# Patient Record
Sex: Male | Born: 1965 | Race: White | Hispanic: No | Marital: Married | Smoking: Never smoker
Health system: Southern US, Community
[De-identification: ages and names within clinical notes are randomized; demographics above are authoritative.]

## PROBLEM LIST (undated history)

## (undated) DIAGNOSIS — S069X9A Unspecified intracranial injury with loss of consciousness of unspecified duration, initial encounter: Secondary | ICD-10-CM

## (undated) DIAGNOSIS — I447 Left bundle-branch block, unspecified: Secondary | ICD-10-CM

## (undated) DIAGNOSIS — K219 Gastro-esophageal reflux disease without esophagitis: Secondary | ICD-10-CM

## (undated) DIAGNOSIS — E1342 Other specified diabetes mellitus with diabetic polyneuropathy: Secondary | ICD-10-CM

## (undated) DIAGNOSIS — G4730 Sleep apnea, unspecified: Secondary | ICD-10-CM

## (undated) DIAGNOSIS — E119 Type 2 diabetes mellitus without complications: Secondary | ICD-10-CM

## (undated) DIAGNOSIS — I10 Essential (primary) hypertension: Secondary | ICD-10-CM

## (undated) DIAGNOSIS — S069XAA Unspecified intracranial injury with loss of consciousness status unknown, initial encounter: Secondary | ICD-10-CM

## (undated) DIAGNOSIS — H9192 Unspecified hearing loss, left ear: Secondary | ICD-10-CM

## (undated) DIAGNOSIS — I2510 Atherosclerotic heart disease of native coronary artery without angina pectoris: Secondary | ICD-10-CM

## (undated) HISTORY — PX: FISTULOTOMY: SHX6413

## (undated) HISTORY — PX: PENILE PROSTHESIS IMPLANT: SHX240

## (undated) HISTORY — PX: OTHER SURGICAL HISTORY: SHX169

## (undated) HISTORY — PX: TONSILLECTOMY: SUR1361

## (undated) HISTORY — PX: FRACTURE SURGERY: SHX138

---

## 2007-07-20 ENCOUNTER — Emergency Department (HOSPITAL_COMMUNITY): Admission: EM | Admit: 2007-07-20 | Discharge: 2007-07-21 | Payer: Self-pay | Admitting: Emergency Medicine

## 2008-10-15 IMAGING — CR DG ELBOW COMPLETE 3+V*L*
4 series · 4 of 4 positions shown · non-contrast
Comparison: None

CLINICAL DATA: Kicked by a horse.  Elbow pain.

LEFT ELBOW - COMPLETE 3+ VIEW

[x elbow joint ap left]
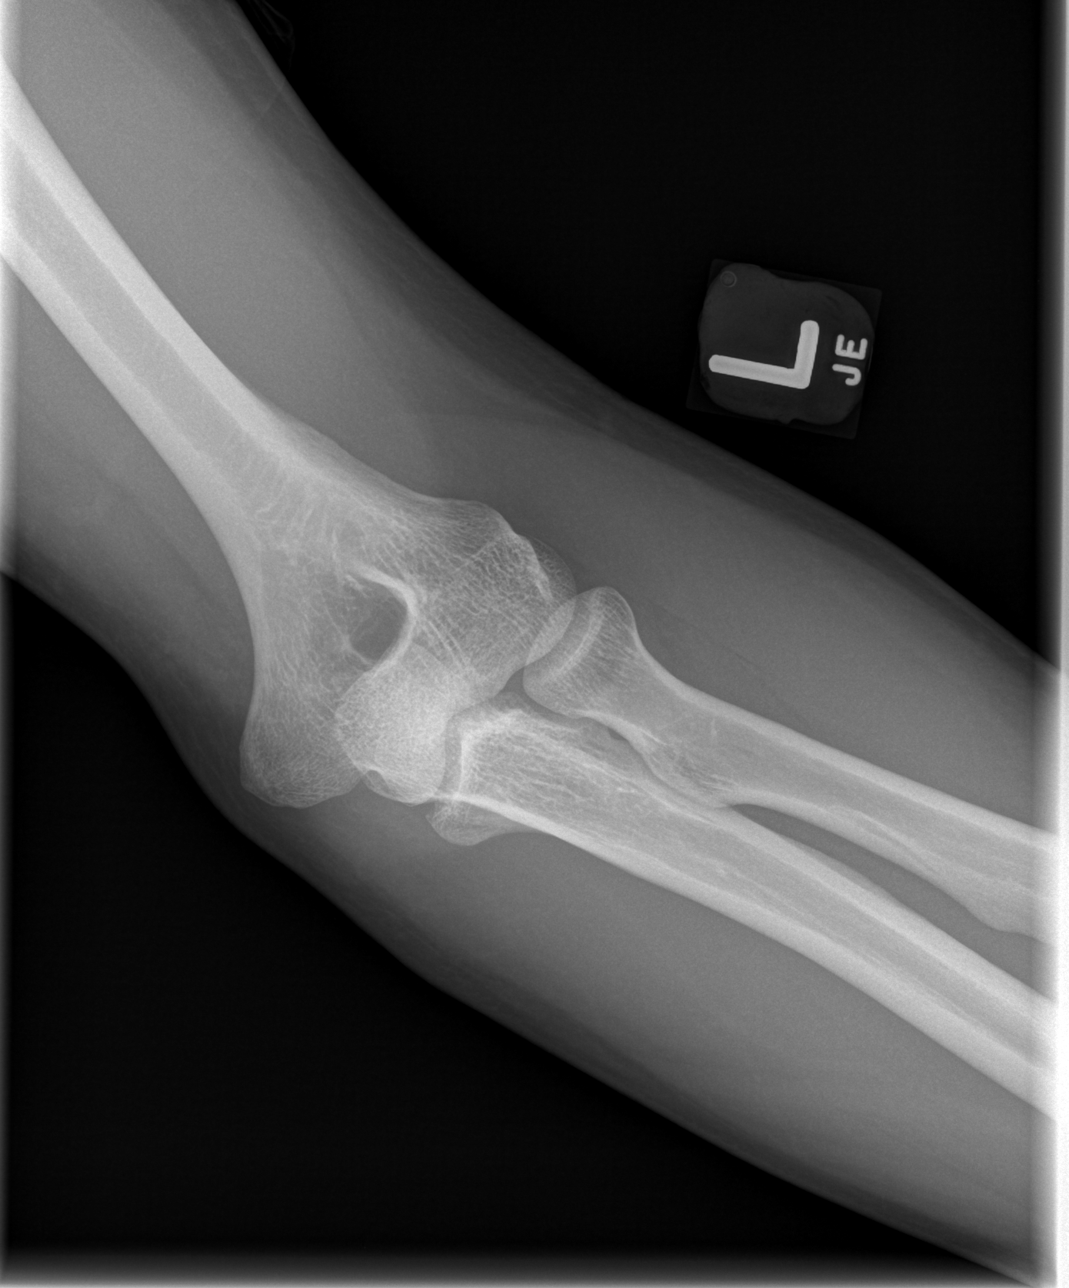

[x elbow joint obl. left (1 of 2)]
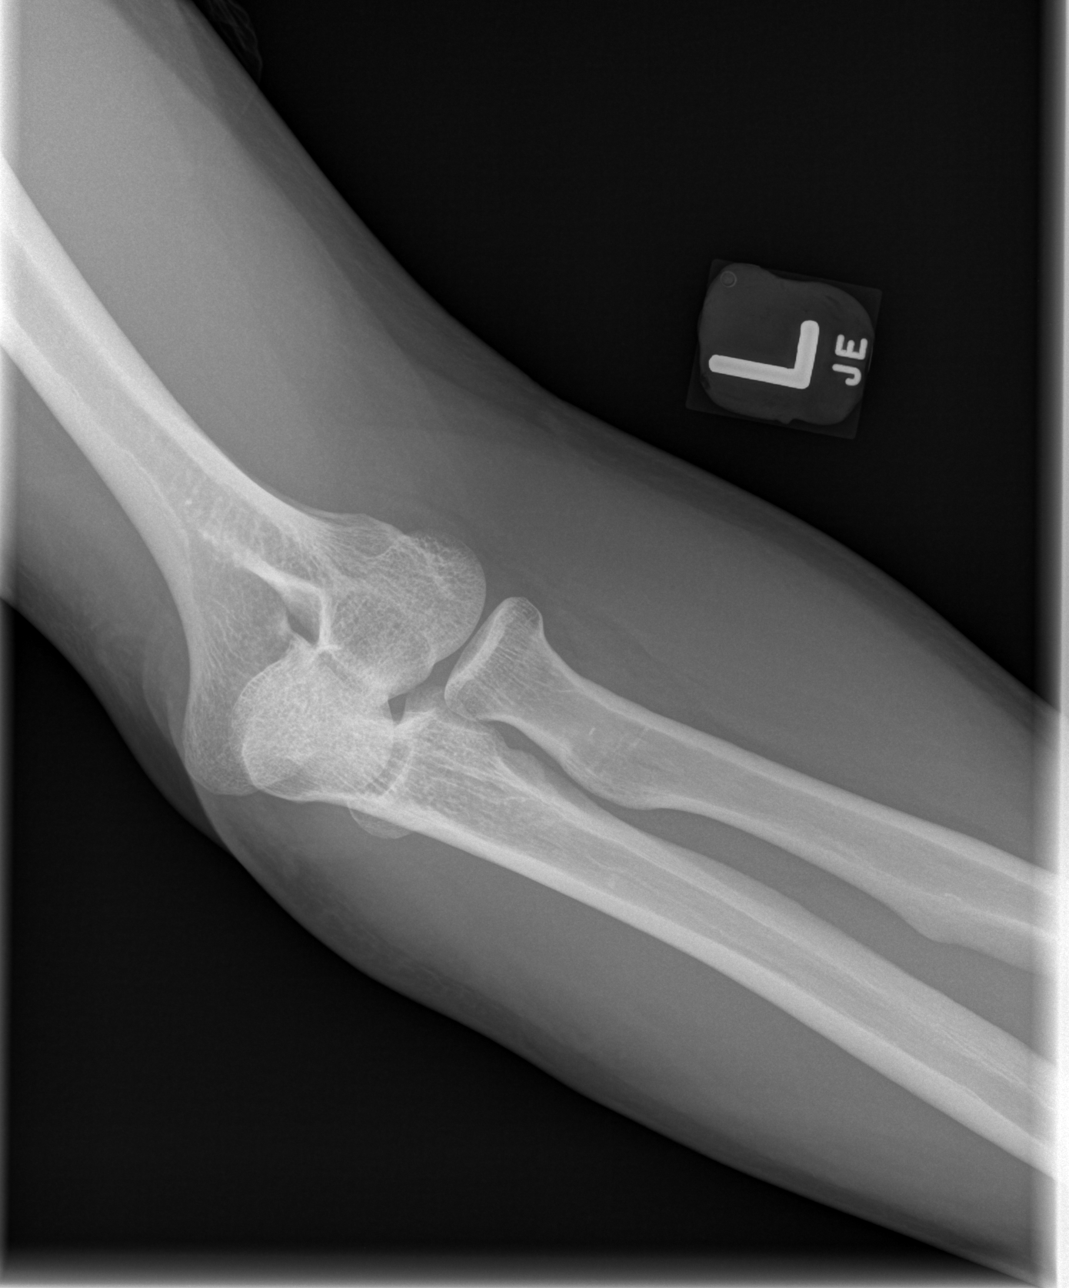

[x elbow joint obl. left (2 of 2)]
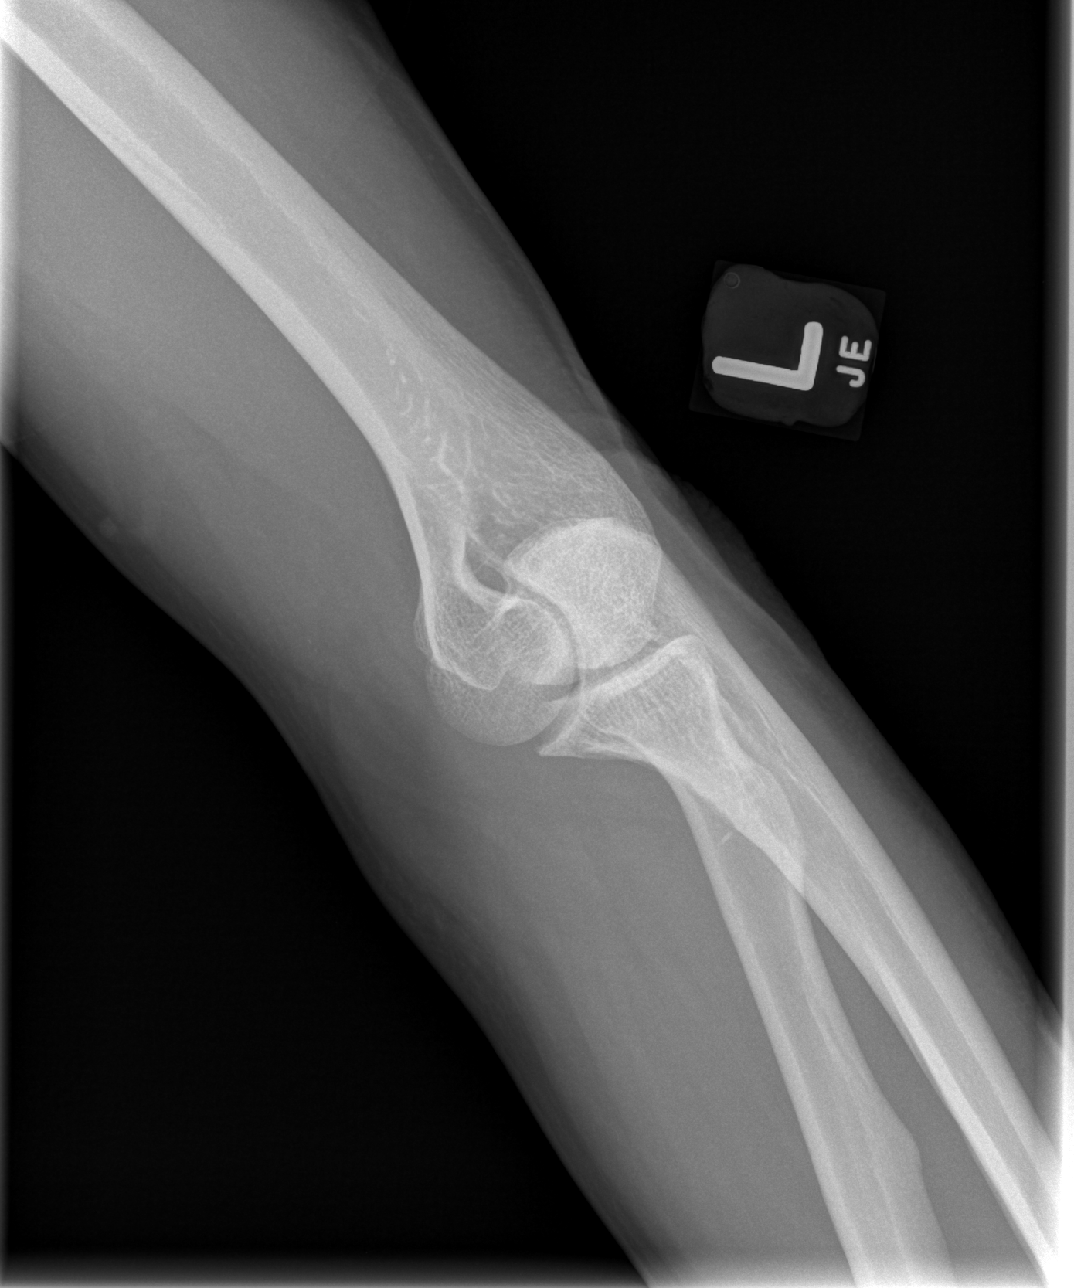

[x elbow joint lat left]
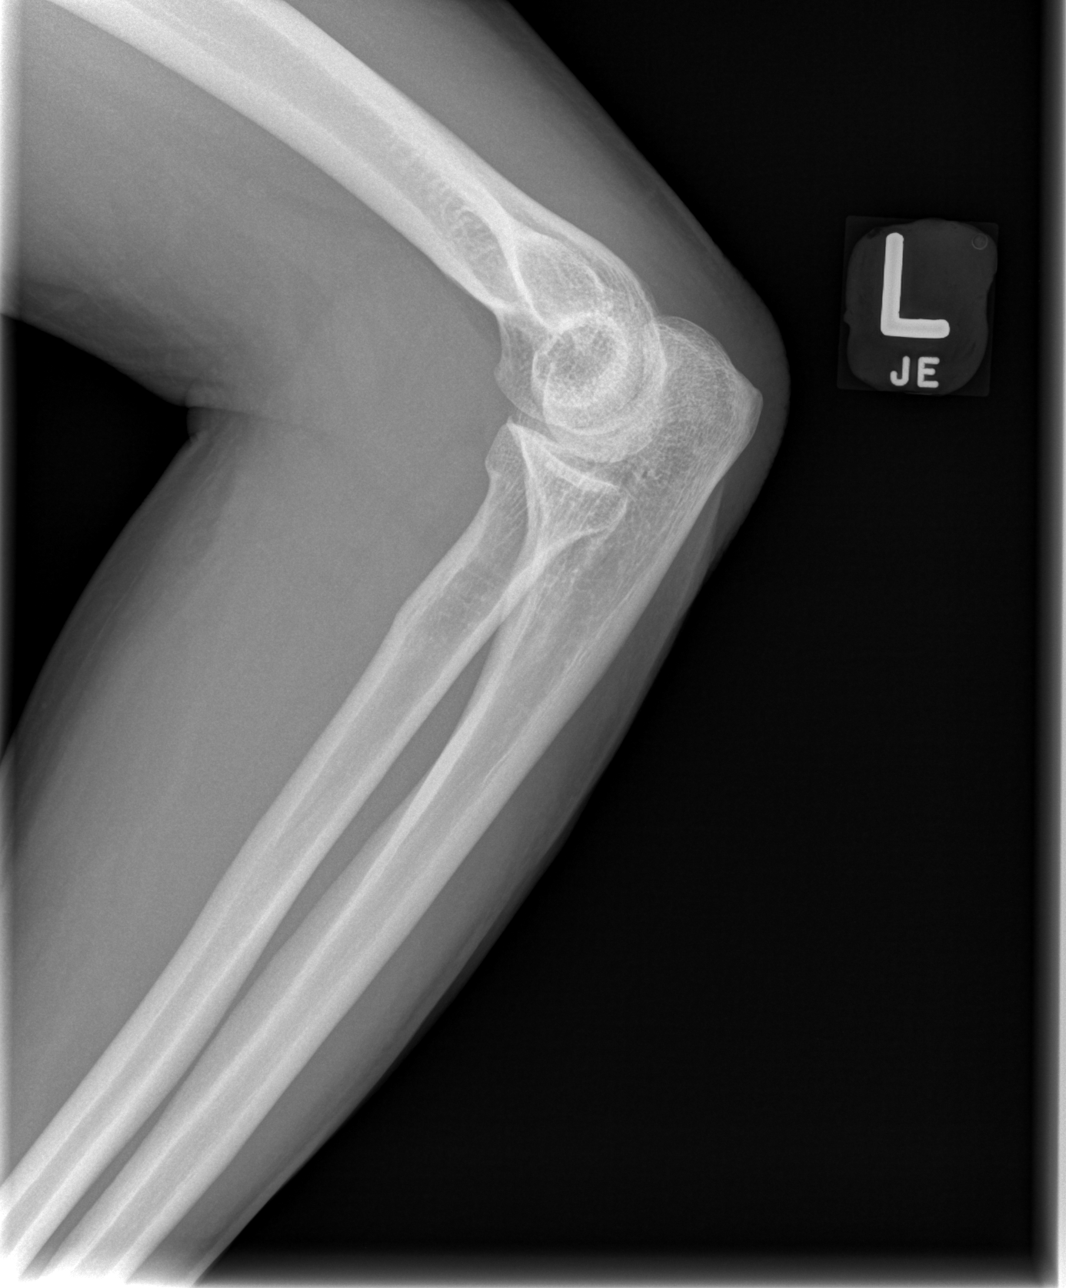

[4 of 4 positions shown; findings below may reference images not displayed]

FINDINGS: No elbow joint effusion or dislocation is demonstrated.
There is irregularity of the proximal ulna medially on the AP view
which could reflect a nondisplaced fracture.  The radial head and
distal humerus appear normal.
IMPRESSION: Possible nondisplaced fracture of the proximal ulna.  There is no
associated joint effusion or subluxation.

## 2014-02-28 HISTORY — PX: FACIAL FRACTURE SURGERY: SHX1570

## 2016-10-26 HISTORY — PX: CARDIAC CATHETERIZATION: SHX172

## 2017-02-17 ENCOUNTER — Encounter (HOSPITAL_BASED_OUTPATIENT_CLINIC_OR_DEPARTMENT_OTHER): Payer: Self-pay | Admitting: *Deleted

## 2017-02-17 NOTE — Progress Notes (Signed)
Pt's PMH  Reviewed with Dr Chaney MallingHodierne. Pt does not need any cardiac clearance for surgery 02/24/17. He asked that we print cardiology notes from care everywhere to have easily available on pt's chart dos.  EKG also faxed for Dr. Shawna OrleansBodek's office.

## 2017-02-20 ENCOUNTER — Ambulatory Visit: Payer: Self-pay | Admitting: Ophthalmology

## 2017-02-23 ENCOUNTER — Ambulatory Visit: Payer: Self-pay | Admitting: Ophthalmology
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
## 2017-02-24 ENCOUNTER — Encounter (HOSPITAL_BASED_OUTPATIENT_CLINIC_OR_DEPARTMENT_OTHER): Payer: Self-pay | Admitting: Anesthesiology

## 2017-02-24 ENCOUNTER — Ambulatory Visit (HOSPITAL_BASED_OUTPATIENT_CLINIC_OR_DEPARTMENT_OTHER): Payer: BLUE CROSS/BLUE SHIELD | Admitting: Anesthesiology

## 2017-02-24 ENCOUNTER — Encounter (HOSPITAL_BASED_OUTPATIENT_CLINIC_OR_DEPARTMENT_OTHER)
Admission: RE | Disposition: A | Discharge status: Home / Self Care | Payer: Self-pay | Source: Ambulatory Visit | Attending: Ophthalmology

## 2017-02-24 ENCOUNTER — Ambulatory Visit (HOSPITAL_BASED_OUTPATIENT_CLINIC_OR_DEPARTMENT_OTHER)
Admission: RE | Admit: 2017-02-24 | Discharge: 2017-02-24 | Disposition: A | Discharge status: Home / Self Care | Payer: BLUE CROSS/BLUE SHIELD | Source: Ambulatory Visit | Attending: Ophthalmology | Admitting: Ophthalmology

## 2017-02-24 ENCOUNTER — Other Ambulatory Visit: Payer: Self-pay

## 2017-02-24 DIAGNOSIS — E1142 Type 2 diabetes mellitus with diabetic polyneuropathy: Secondary | ICD-10-CM | POA: Diagnosis not present

## 2017-02-24 DIAGNOSIS — H4912 Fourth [trochlear] nerve palsy, left eye: Secondary | ICD-10-CM | POA: Diagnosis not present

## 2017-02-24 DIAGNOSIS — I2510 Atherosclerotic heart disease of native coronary artery without angina pectoris: Secondary | ICD-10-CM | POA: Diagnosis not present

## 2017-02-24 DIAGNOSIS — Z87820 Personal history of traumatic brain injury: Secondary | ICD-10-CM | POA: Diagnosis not present

## 2017-02-24 DIAGNOSIS — K219 Gastro-esophageal reflux disease without esophagitis: Secondary | ICD-10-CM | POA: Diagnosis not present

## 2017-02-24 DIAGNOSIS — Z794 Long term (current) use of insulin: Secondary | ICD-10-CM | POA: Insufficient documentation

## 2017-02-24 DIAGNOSIS — H9192 Unspecified hearing loss, left ear: Secondary | ICD-10-CM | POA: Diagnosis not present

## 2017-02-24 DIAGNOSIS — Z79899 Other long term (current) drug therapy: Secondary | ICD-10-CM | POA: Insufficient documentation

## 2017-02-24 DIAGNOSIS — Z7982 Long term (current) use of aspirin: Secondary | ICD-10-CM | POA: Diagnosis not present

## 2017-02-24 HISTORY — DX: Unspecified hearing loss, left ear: H91.92

## 2017-02-24 HISTORY — DX: Unspecified intracranial injury with loss of consciousness status unknown, initial encounter: S06.9XAA

## 2017-02-24 HISTORY — DX: Left bundle-branch block, unspecified: I44.7

## 2017-02-24 HISTORY — DX: Gastro-esophageal reflux disease without esophagitis: K21.9

## 2017-02-24 HISTORY — DX: Other specified diabetes mellitus with diabetic polyneuropathy: E13.42

## 2017-02-24 HISTORY — DX: Type 2 diabetes mellitus without complications: E11.9

## 2017-02-24 HISTORY — DX: Atherosclerotic heart disease of native coronary artery without angina pectoris: I25.10

## 2017-02-24 HISTORY — PX: STRABISMUS SURGERY: SHX218

## 2017-02-24 HISTORY — DX: Unspecified intracranial injury with loss of consciousness of unspecified duration, initial encounter: S06.9X9A

## 2017-02-24 LAB — POCT I-STAT, CHEM 8
BUN: 16 mg/dL (ref 6–20)
CREATININE: 0.9 mg/dL (ref 0.61–1.24)
Calcium, Ion: 1.17 mmol/L (ref 1.15–1.40)
Chloride: 100 mmol/L — ABNORMAL LOW (ref 101–111)
GLUCOSE: 78 mg/dL (ref 65–99)
HCT: 52 % (ref 39.0–52.0)
HEMOGLOBIN: 17.7 g/dL — AB (ref 13.0–17.0)
POTASSIUM: 3.9 mmol/L (ref 3.5–5.1)
Sodium: 141 mmol/L (ref 135–145)
TCO2: 28 mmol/L (ref 22–32)

## 2017-02-24 LAB — GLUCOSE, CAPILLARY
GLUCOSE-CAPILLARY: 82 mg/dL (ref 65–99)
Glucose-Capillary: 59 mg/dL — ABNORMAL LOW (ref 65–99)
Glucose-Capillary: 74 mg/dL (ref 65–99)

## 2017-02-24 SURGERY — REPAIR STRABISMUS
Anesthesia: General | Site: Eye | Laterality: Left

## 2017-02-24 MED ORDER — LACTATED RINGERS IV SOLN
INTRAVENOUS | Status: DC
  Administered 2017-02-24: 11:00:00 via INTRAVENOUS

## 2017-02-24 MED ORDER — HYDROMORPHONE HCL 1 MG/ML IJ SOLN: 0.2500 mg | INTRAMUSCULAR | Status: DC | PRN

## 2017-02-24 MED ORDER — ONDANSETRON HCL 4 MG/2ML IJ SOLN
INTRAMUSCULAR | Status: AC
  Filled 2017-02-24: qty 2

## 2017-02-24 MED ORDER — FENTANYL CITRATE (PF) 100 MCG/2ML IJ SOLN
INTRAMUSCULAR | Status: AC
  Filled 2017-02-24: qty 2

## 2017-02-24 MED ORDER — LABETALOL HCL 5 MG/ML IV SOLN
INTRAVENOUS | Status: AC
  Filled 2017-02-24: qty 4

## 2017-02-24 MED ORDER — LIDOCAINE 2% (20 MG/ML) 5 ML SYRINGE
INTRAMUSCULAR | Status: DC | PRN
  Administered 2017-02-24: 50 mg via INTRAVENOUS

## 2017-02-24 MED ORDER — SCOPOLAMINE 1 MG/3DAYS TD PT72: 1.0000 | MEDICATED_PATCH | Freq: Once | TRANSDERMAL | Status: DC | PRN

## 2017-02-24 MED ORDER — MEPERIDINE HCL 25 MG/ML IJ SOLN: 6.2500 mg | INTRAMUSCULAR | Status: DC | PRN

## 2017-02-24 MED ORDER — DEXAMETHASONE SODIUM PHOSPHATE 4 MG/ML IJ SOLN
INTRAMUSCULAR | Status: DC | PRN
  Administered 2017-02-24: 10 mg via INTRAVENOUS

## 2017-02-24 MED ORDER — DEXTROSE 50 % IV SOLN
INTRAVENOUS | Status: AC
  Filled 2017-02-24: qty 50

## 2017-02-24 MED ORDER — GLYCOPYRROLATE 0.2 MG/ML IJ SOLN
INTRAMUSCULAR | Status: DC | PRN
  Administered 2017-02-24: .4 mg via INTRAVENOUS

## 2017-02-24 MED ORDER — MIDAZOLAM HCL 2 MG/2ML IJ SOLN
INTRAMUSCULAR | Status: AC
  Filled 2017-02-24: qty 2

## 2017-02-24 MED ORDER — ONDANSETRON HCL 4 MG/2ML IJ SOLN
INTRAMUSCULAR | Status: DC | PRN
  Administered 2017-02-24: 4 mg via INTRAVENOUS

## 2017-02-24 MED ORDER — PROMETHAZINE HCL 25 MG/ML IJ SOLN: 6.2500 mg | INTRAMUSCULAR | Status: DC | PRN

## 2017-02-24 MED ORDER — OXYCODONE HCL 5 MG/5ML PO SOLN: 5.0000 mg | Freq: Once | ORAL | Status: DC | PRN

## 2017-02-24 MED ORDER — LABETALOL HCL 5 MG/ML IV SOLN
INTRAVENOUS | Status: DC | PRN
  Administered 2017-02-24: 5 mg via INTRAVENOUS

## 2017-02-24 MED ORDER — DEXTROSE 50 % IV SOLN
INTRAVENOUS | Status: DC | PRN
  Administered 2017-02-24 (×2): .5 via INTRAVENOUS

## 2017-02-24 MED ORDER — PROPOFOL 10 MG/ML IV BOLUS
INTRAVENOUS | Status: DC | PRN
  Administered 2017-02-24: 200 mg via INTRAVENOUS

## 2017-02-24 MED ORDER — OXYCODONE HCL 5 MG PO TABS: 5.0000 mg | ORAL_TABLET | Freq: Once | ORAL | Status: DC | PRN

## 2017-02-24 MED ORDER — DEXAMETHASONE SODIUM PHOSPHATE 10 MG/ML IJ SOLN
INTRAMUSCULAR | Status: AC
  Filled 2017-02-24: qty 1

## 2017-02-24 MED ORDER — FENTANYL CITRATE (PF) 100 MCG/2ML IJ SOLN
50.0000 ug | INTRAMUSCULAR | Status: DC | PRN
  Administered 2017-02-24: 100 ug via INTRAVENOUS
  Administered 2017-02-24: 50 ug via INTRAVENOUS

## 2017-02-24 MED ORDER — MIDAZOLAM HCL 2 MG/2ML IJ SOLN
1.0000 mg | INTRAMUSCULAR | Status: DC | PRN
  Administered 2017-02-24: 2 mg via INTRAVENOUS

## 2017-02-24 MED ORDER — TOBRAMYCIN-DEXAMETHASONE 0.3-0.1 % OP OINT
TOPICAL_OINTMENT | OPHTHALMIC | Status: DC | PRN
  Administered 2017-02-24: 1 via OPHTHALMIC

## 2017-02-24 SURGICAL SUPPLY — 31 items
APPLICATOR COTTON TIP 6IN STRL (MISCELLANEOUS) ×12 IMPLANT
APPLICATOR DR MATTHEWS STRL (MISCELLANEOUS) ×3 IMPLANT
BANDAGE EYE OVAL (MISCELLANEOUS) IMPLANT
CAUTERY EYE LOW TEMP 1300F FIN (OPHTHALMIC RELATED) IMPLANT
CLOSURE WOUND 1/4X4 (GAUZE/BANDAGES/DRESSINGS)
COVER BACK TABLE 60X90IN (DRAPES) ×3 IMPLANT
COVER MAYO STAND STRL (DRAPES) ×3 IMPLANT
DRAPE SURG 17X23 STRL (DRAPES) ×6 IMPLANT
DRAPE U-SHAPE 76X120 STRL (DRAPES) ×3 IMPLANT
GLOVE BIO SURGEON STRL SZ 6.5 (GLOVE) ×4 IMPLANT
GLOVE BIO SURGEONS STRL SZ 6.5 (GLOVE) ×2
GLOVE BIOGEL M STRL SZ7.5 (GLOVE) ×3 IMPLANT
GOWN STRL REUS W/ TWL LRG LVL3 (GOWN DISPOSABLE) ×1 IMPLANT
GOWN STRL REUS W/TWL LRG LVL3 (GOWN DISPOSABLE) ×2
GOWN STRL REUS W/TWL XL LVL3 (GOWN DISPOSABLE) ×3 IMPLANT
NS IRRIG 1000ML POUR BTL (IV SOLUTION) ×3 IMPLANT
PACK BASIN DAY SURGERY FS (CUSTOM PROCEDURE TRAY) ×3 IMPLANT
SHEET MEDIUM DRAPE 40X70 STRL (DRAPES) IMPLANT
SPEAR EYE SURG WECK-CEL (MISCELLANEOUS) ×6 IMPLANT
STRIP CLOSURE SKIN 1/4X4 (GAUZE/BANDAGES/DRESSINGS) IMPLANT
SUT 6 0 SILK T G140 8DA (SUTURE) IMPLANT
SUT MERSILENE 6-0 18IN S14 8MM (SUTURE) ×3
SUT PLAIN 6 0 TG1408 (SUTURE) ×3 IMPLANT
SUT SILK 4 0 C 3 735G (SUTURE) IMPLANT
SUT VICRYL 6 0 S 28 (SUTURE) IMPLANT
SUT VICRYL ABS 6-0 S29 18IN (SUTURE) IMPLANT
SUTURE MERSLN 6-0 18IN S14 8MM (SUTURE) ×1 IMPLANT
SYR 10ML LL (SYRINGE) ×3 IMPLANT
SYR TB 1ML LL NO SAFETY (SYRINGE) ×3 IMPLANT
TOWEL OR 17X24 6PK STRL BLUE (TOWEL DISPOSABLE) ×3 IMPLANT
TRAY DSU PREP LF (CUSTOM PROCEDURE TRAY) ×3 IMPLANT

## 2017-02-24 NOTE — Anesthesia Postprocedure Evaluation (Signed)
Anesthesia Post Note  Patient: Jeremy Alterry R Croghan Jr.  Procedure(s) Performed: REPAIR STRABISMUS LEFT EYE (Left Eye)     Patient location during evaluation: PACU Anesthesia Type: General Level of consciousness: awake and alert Pain management: pain level controlled Vital Signs Assessment: post-procedure vital signs reviewed and stable Respiratory status: spontaneous breathing, nonlabored ventilation and respiratory function stable Cardiovascular status: blood pressure returned to baseline and stable Postop Assessment: no apparent nausea or vomiting Anesthetic complications: no    Last Vitals:  Vitals:   02/24/17 1300 02/24/17 1315  BP: (!) 161/90 (!) 171/96  Pulse: 81 81  Resp: 17 16  Temp:    SpO2: 93% 94%    Last Pain:  Vitals:   02/24/17 1245  TempSrc:   PainSc: Asleep                 Lowella CurbWarren Ray Melayah Skorupski

## 2017-02-24 NOTE — Anesthesia Procedure Notes (Signed)
Procedure Name: LMA Insertion Performed by: Eyden Dobie W, CRNA Pre-anesthesia Checklist: Patient identified, Emergency Drugs available, Suction available and Patient being monitored Patient Re-evaluated:Patient Re-evaluated prior to induction Oxygen Delivery Method: Circle system utilized Preoxygenation: Pre-oxygenation with 100% oxygen Induction Type: IV induction Ventilation: Mask ventilation without difficulty LMA: LMA flexible inserted LMA Size: 4.0 Number of attempts: 1 Placement Confirmation: positive ETCO2 Tube secured with: Tape Dental Injury: Teeth and Oropharynx as per pre-operative assessment        

## 2017-02-24 NOTE — Op Note (Signed)
02/24/2017  12:31 PM  PATIENT:  Jeremy Wood Jr.    PRE-OPERATIVE DIAGNOSIS:  LEFT SUPERIOR OBLIQUE PALSY, TRAUMATIC  POST-OPERATIVE DIAGNOSIS:  same  PROCEDURE:  Left superior oblique tuck 12 mm  SURGEON:  Shara BlazingWilliam O Ryoma Nofziger, MD  ANESTHESIA:   General  COMPLICATIONS: none  OPERATIVE PROCEDURE: After preoperative evaluation including informed consent, the patient was taken to the operating room where he was identified by me.  General anesthesia was induced without difficulty after placement of appropriate monitors.  The patient was prepped and draped in standard sterile fashion.  A lid speculum was placed in the left eye.  A marking pen was used to mark the superior and inferior limbus.  Through an inferonasal fornix incision through conjunctiva and tenons fascia, the left lateral rectus muscle was engaged on a series of muscle hooks.  A Barbee retractor was introduced into the conjunctival incision and drawn posteriorly, exposing the superior oblique tendon along the nasal border of the superior rectus muscle.  The tendon was engaged on oblique hook, and then on a Bishop tendon tucker.  The Pricilla Holmucker was drawn up 6 mm each limb.  A double armed 6-0 Mersilene suture was passed through the ascending and descending limb of the superior oblique tendon at the base of the [place]um[place]ucker in a horizontal mattress fashion and tied securely.  Each end of the suture was then passed through the ascending and descending limbs of the tendon in the opposite direction and again tied securely.  Finally, the suture was wrapped around both limbs and tied once more.  Pricilla Holmucker was removed.  Conjunctiva was closed with a single 6-0 plain gut suture.  The marks which had been made at superior and inferior limbus at the start of the procedure showed definite intorsion at the end of the procedure-I would estimate perhaps 10 degrees.  TobraDex ophthalmic ointment was placed in the eye.  The patient was awakened without difficulty and  taken to the recovery room in stable condition, having suffered no intraoperative or immediate postoperative complications.  Shara BlazingWilliam O Najmo Pardue, MD

## 2017-02-24 NOTE — Transfer of Care (Signed)
Immediate Anesthesia Transfer of Care Note  Patient: Jeremy Alterry R Crear Jr.  Procedure(s) Performed: REPAIR STRABISMUS LEFT EYE (Left Eye)  Patient Location: PACU  Anesthesia Type:General  Level of Consciousness: awake and sedated  Airway & Oxygen Therapy: Patient Spontanous Breathing and Patient connected to face mask oxygen  Post-op Assessment: Report given to RN and Post -op Vital signs reviewed and stable  Post vital signs: Reviewed and stable  Last Vitals:  Vitals:   02/24/17 1013 02/24/17 1232  BP: (!) 139/91 (!) 148/91  Pulse: 69 90  Resp: 20 17  Temp: 36.8 C   SpO2: 97% 96%    Last Pain:  Vitals:   02/24/17 1232  TempSrc:   PainSc: (P) Asleep      Patients Stated Pain Goal: 2 (02/24/17 1013)  Complications: No apparent anesthesia complications

## 2017-02-24 NOTE — Interval H&P Note (Signed)
History and Physical Interval Note:  02/24/2017 11:22 AM  Jeremy Alterry R Huffaker Jr.  has presented today for surgery, with the diagnosis of LEFT SUPERIOR OBLIQUE PALSY  The various methods of treatment have been discussed with the patient and family. After consideration of risks, benefits and other options for treatment, the patient has consented to  Procedure(s): REPAIR STRABISMUS LEFT EYE (Left) as a surgical intervention .  The patient's history has been reviewed, patient examined, no change in status, stable for surgery.  I have reviewed the patient's chart and labs.  Questions were answered to the patient's satisfaction.     Shara BlazingWilliam O Montrae Braithwaite

## 2017-02-24 NOTE — Anesthesia Preprocedure Evaluation (Signed)
Anesthesia Evaluation  Patient identified by MRN, date of birth, ID band Patient awake    Reviewed: Allergy & Precautions, NPO status , Patient's Chart, lab work & pertinent test results  Airway Mallampati: II  TM Distance: >3 FB Neck ROM: Full    Dental no notable dental hx.    Pulmonary neg pulmonary ROS,    Pulmonary exam normal breath sounds clear to auscultation       Cardiovascular + CAD  negative cardio ROS Normal cardiovascular exam Rhythm:Regular Rate:Normal     Neuro/Psych negative neurological ROS  negative psych ROS   GI/Hepatic negative GI ROS, Neg liver ROS, GERD  ,  Endo/Other  negative endocrine ROSdiabetes, Type 2, Insulin Dependent  Renal/GU negative Renal ROS  negative genitourinary   Musculoskeletal negative musculoskeletal ROS (+)   Abdominal   Peds negative pediatric ROS (+)  Hematology negative hematology ROS (+)   Anesthesia Other Findings   Reproductive/Obstetrics negative OB ROS                             Anesthesia Physical Anesthesia Plan  ASA: III  Anesthesia Plan: General   Post-op Pain Management:    Induction: Intravenous  PONV Risk Score and Plan: 2 and Ondansetron and Midazolam  Airway Management Planned: LMA  Additional Equipment:   Intra-op Plan:   Post-operative Plan: Extubation in OR  Informed Consent: I have reviewed the patients History and Physical, chart, labs and discussed the procedure including the risks, benefits and alternatives for the proposed anesthesia with the patient or authorized representative who has indicated his/her understanding and acceptance.   Dental advisory given  Plan Discussed with: CRNA  Anesthesia Plan Comments:         Anesthesia Quick Evaluation

## 2017-02-24 NOTE — Discharge Instructions (Signed)
Post Anesthesia Home Care Instructions  Activity: Get plenty of rest for the remainder of the day. A responsible individual must stay with you for 24 hours following the procedure.  For the next 24 hours, DO NOT: -Drive a car -Advertising copywriterperate machinery -Drink alcoholic beverages -Take any medication unless instructed by your physician -Make any legal decisions or sign important papers.  Meals: Start with liquid foods such as gelatin or soup. Progress to regular foods as tolerated. Avoid greasy, spicy, heavy foods. If nausea and/or vomiting occur, drink only clear liquids until the nausea and/or vomiting subsides. Call your physician if vomiting continues.  Special Instructions/Symptoms: Your throat may feel dry or sore from the anesthesia or the breathing tube placed in your throat during surgery. If this causes discomfort, gargle with warm salt water. The discomfort should disappear within 24 hours.  If you had a scopolamine patch placed behind your ear for the management of post- operative nausea and/or vomiting:  1. The medication in the patch is effective for 72 hours, after which it should be removed.  Wrap patch in a tissue and discard in the trash. Wash hands thoroughly with soap and water. 2. You may remove the patch earlier than 72 hours if you experience unpleasant side effects which may include dry mouth, dizziness or visual disturbances. 3. Avoid touching the patch. Wash your hands with soap and water after contact with the patch.   Diet: Clear liquids, advance to soft foods then regular diet as tolerated by this evening.  Pain control:   1)  Ibuprofen 600 mg by mouth every 6-8 hours as needed for pain  2)  Ice pack/cold compress to operated eye(s) as desired  Eye medications:    Tobradex  eye ointment 1/2 inch in operated eye(s) twice a day   Activity: No swimming for 1 week.  It is OK to let water run over the face and eyes while showering or taking a bath, even during the  first week.  No other restriction on exercise or activity.   Call Dr. Roxy CedarYoung's office [phone] with any problems or concerns.   Post Anesthesia Home Care Instructions  Activity: Get plenty of rest for the remainder of the day. A responsible individual must stay with you for 24 hours following the procedure.  For the next 24 hours, DO NOT: -Drive a car -Advertising copywriterperate machinery -Drink alcoholic beverages -Take any medication unless instructed by your physician -Make any legal decisions or sign important papers.  Meals: Start with liquid foods such as gelatin or soup. Progress to regular foods as tolerated. Avoid greasy, spicy, heavy foods. If nausea and/or vomiting occur, drink only clear liquids until the nausea and/or vomiting subsides. Call your physician if vomiting continues.  Special Instructions/Symptoms: Your throat may feel dry or sore from the anesthesia or the breathing tube placed in your throat during surgery. If this causes discomfort, gargle with warm salt water. The discomfort should disappear within 24 hours.  If you had a scopolamine patch placed behind your ear for the management of post- operative nausea and/or vomiting:  1. The medication in the patch is effective for 72 hours, after which it should be removed.  Wrap patch in a tissue and discard in the trash. Wash hands thoroughly with soap and water. 2. You may remove the patch earlier than 72 hours if you experience unpleasant side effects which may include dry mouth, dizziness or visual disturbances. 3. Avoid touching the patch. Wash your hands with soap and water after  patch. °  ° ° °

## 2017-02-26 ENCOUNTER — Encounter (HOSPITAL_BASED_OUTPATIENT_CLINIC_OR_DEPARTMENT_OTHER): Payer: Self-pay | Admitting: Ophthalmology

## 2017-11-10 ENCOUNTER — Ambulatory Visit: Payer: Self-pay | Admitting: Ophthalmology

## 2017-11-13 ENCOUNTER — Encounter (HOSPITAL_BASED_OUTPATIENT_CLINIC_OR_DEPARTMENT_OTHER): Payer: Self-pay | Admitting: *Deleted

## 2017-11-14 ENCOUNTER — Encounter (HOSPITAL_BASED_OUTPATIENT_CLINIC_OR_DEPARTMENT_OTHER): Payer: Self-pay | Admitting: *Deleted

## 2017-11-14 ENCOUNTER — Other Ambulatory Visit: Payer: Self-pay

## 2017-11-14 NOTE — Progress Notes (Signed)
Chart, EKG, ECHO all reviewed with Dr Malen GauzeFoster, [place] [facility].

## 2017-11-17 ENCOUNTER — Other Ambulatory Visit: Payer: Self-pay

## 2017-11-17 ENCOUNTER — Ambulatory Visit (HOSPITAL_BASED_OUTPATIENT_CLINIC_OR_DEPARTMENT_OTHER): Payer: BLUE CROSS/BLUE SHIELD | Admitting: Anesthesiology

## 2017-11-17 ENCOUNTER — Encounter (HOSPITAL_BASED_OUTPATIENT_CLINIC_OR_DEPARTMENT_OTHER)
Admission: RE | Disposition: A | Discharge status: Home / Self Care | Payer: Self-pay | Source: Ambulatory Visit | Attending: Ophthalmology

## 2017-11-17 ENCOUNTER — Ambulatory Visit: Payer: Self-pay | Admitting: Ophthalmology

## 2017-11-17 ENCOUNTER — Encounter (HOSPITAL_BASED_OUTPATIENT_CLINIC_OR_DEPARTMENT_OTHER): Payer: Self-pay | Admitting: *Deleted

## 2017-11-17 ENCOUNTER — Ambulatory Visit (HOSPITAL_BASED_OUTPATIENT_CLINIC_OR_DEPARTMENT_OTHER)
Admission: RE | Admit: 2017-11-17 | Discharge: 2017-11-17 | Disposition: A | Discharge status: Home / Self Care | Payer: BLUE CROSS/BLUE SHIELD | Source: Ambulatory Visit | Attending: Ophthalmology | Admitting: Ophthalmology

## 2017-11-17 DIAGNOSIS — E1142 Type 2 diabetes mellitus with diabetic polyneuropathy: Secondary | ICD-10-CM | POA: Insufficient documentation

## 2017-11-17 DIAGNOSIS — Z6834 Body mass index (BMI) 34.0-34.9, adult: Secondary | ICD-10-CM | POA: Insufficient documentation

## 2017-11-17 DIAGNOSIS — E669 Obesity, unspecified: Secondary | ICD-10-CM | POA: Diagnosis not present

## 2017-11-17 DIAGNOSIS — G4730 Sleep apnea, unspecified: Secondary | ICD-10-CM | POA: Diagnosis not present

## 2017-11-17 DIAGNOSIS — Z87820 Personal history of traumatic brain injury: Secondary | ICD-10-CM | POA: Diagnosis not present

## 2017-11-17 DIAGNOSIS — H4912 Fourth [trochlear] nerve palsy, left eye: Secondary | ICD-10-CM | POA: Diagnosis present

## 2017-11-17 DIAGNOSIS — I10 Essential (primary) hypertension: Secondary | ICD-10-CM | POA: Diagnosis not present

## 2017-11-17 DIAGNOSIS — I447 Left bundle-branch block, unspecified: Secondary | ICD-10-CM | POA: Diagnosis not present

## 2017-11-17 DIAGNOSIS — H532 Diplopia: Secondary | ICD-10-CM | POA: Diagnosis not present

## 2017-11-17 HISTORY — DX: Essential (primary) hypertension: I10

## 2017-11-17 HISTORY — DX: Sleep apnea, unspecified: G47.30

## 2017-11-17 HISTORY — PX: STRABISMUS SURGERY: SHX218

## 2017-11-17 LAB — POCT I-STAT, CHEM 8
BUN: 25 mg/dL — ABNORMAL HIGH (ref 6–20)
CHLORIDE: 95 mmol/L — AB (ref 98–111)
Calcium, Ion: 1.25 mmol/L (ref 1.15–1.40)
Creatinine, Ser: 1.1 mg/dL (ref 0.61–1.24)
GLUCOSE: 286 mg/dL — AB (ref 70–99)
HEMATOCRIT: 55 % — AB (ref 39.0–52.0)
HEMOGLOBIN: 18.7 g/dL — AB (ref 13.0–17.0)
POTASSIUM: 5.1 mmol/L (ref 3.5–5.1)
Sodium: 137 mmol/L (ref 135–145)
TCO2: 30 mmol/L (ref 22–32)

## 2017-11-17 LAB — GLUCOSE, CAPILLARY: Glucose-Capillary: 216 mg/dL — ABNORMAL HIGH (ref 70–99)

## 2017-11-17 SURGERY — REPAIR STRABISMUS
Anesthesia: General | Site: Eye | Laterality: Bilateral

## 2017-11-17 MED ORDER — DEXMEDETOMIDINE HCL IN NACL 200 MCG/50ML IV SOLN
INTRAVENOUS | Status: DC | PRN
  Administered 2017-11-17: 20 ug via INTRAVENOUS

## 2017-11-17 MED ORDER — SCOPOLAMINE 1 MG/3DAYS TD PT72: 1.0000 | MEDICATED_PATCH | Freq: Once | TRANSDERMAL | Status: DC | PRN

## 2017-11-17 MED ORDER — ONDANSETRON HCL 4 MG/2ML IJ SOLN
INTRAMUSCULAR | Status: AC
  Filled 2017-11-17: qty 2

## 2017-11-17 MED ORDER — LIDOCAINE HCL (CARDIAC) PF 100 MG/5ML IV SOSY
PREFILLED_SYRINGE | INTRAVENOUS | Status: DC | PRN
  Administered 2017-11-17: 60 mg via INTRAVENOUS

## 2017-11-17 MED ORDER — EPHEDRINE 5 MG/ML INJ
INTRAVENOUS | Status: AC
  Filled 2017-11-17: qty 10

## 2017-11-17 MED ORDER — LACTATED RINGERS IV SOLN
INTRAVENOUS | Status: DC
  Administered 2017-11-17 (×2): via INTRAVENOUS

## 2017-11-17 MED ORDER — MIDAZOLAM HCL 2 MG/2ML IJ SOLN
1.0000 mg | INTRAMUSCULAR | Status: DC | PRN
  Administered 2017-11-17: 1 mg via INTRAVENOUS

## 2017-11-17 MED ORDER — SUCCINYLCHOLINE CHLORIDE 200 MG/10ML IV SOSY
PREFILLED_SYRINGE | INTRAVENOUS | Status: AC
  Filled 2017-11-17: qty 10

## 2017-11-17 MED ORDER — FENTANYL CITRATE (PF) 100 MCG/2ML IJ SOLN: 25.0000 ug | INTRAMUSCULAR | Status: DC | PRN

## 2017-11-17 MED ORDER — ONDANSETRON HCL 4 MG/2ML IJ SOLN: 4.0000 mg | Freq: Four times a day (QID) | INTRAMUSCULAR | Status: DC | PRN

## 2017-11-17 MED ORDER — ONDANSETRON HCL 4 MG/2ML IJ SOLN
INTRAMUSCULAR | Status: DC | PRN
  Administered 2017-11-17: 4 mg via INTRAVENOUS

## 2017-11-17 MED ORDER — OXYCODONE HCL 5 MG/5ML PO SOLN: 5.0000 mg | Freq: Once | ORAL | Status: DC | PRN

## 2017-11-17 MED ORDER — GLYCOPYRROLATE 0.2 MG/ML IJ SOLN
INTRAMUSCULAR | Status: DC | PRN
  Administered 2017-11-17: 0.2 mg via INTRAVENOUS

## 2017-11-17 MED ORDER — FENTANYL CITRATE (PF) 100 MCG/2ML IJ SOLN
50.0000 ug | INTRAMUSCULAR | Status: DC | PRN
  Administered 2017-11-17: 100 ug via INTRAVENOUS

## 2017-11-17 MED ORDER — LIDOCAINE 2% (20 MG/ML) 5 ML SYRINGE
INTRAMUSCULAR | Status: AC
  Filled 2017-11-17: qty 5

## 2017-11-17 MED ORDER — TOBRAMYCIN-DEXAMETHASONE 0.3-0.1 % OP OINT
TOPICAL_OINTMENT | OPHTHALMIC | Status: DC | PRN
  Administered 2017-11-17: 1 via OPHTHALMIC

## 2017-11-17 MED ORDER — INSULIN ASPART 100 UNIT/ML ~~LOC~~ SOLN
SUBCUTANEOUS | Status: AC
  Filled 2017-11-17: qty 1

## 2017-11-17 MED ORDER — INSULIN ASPART 100 UNIT/ML ~~LOC~~ SOLN
6.0000 [IU] | Freq: Once | SUBCUTANEOUS | Status: AC
  Administered 2017-11-17: 6 [IU] via SUBCUTANEOUS

## 2017-11-17 MED ORDER — MIDAZOLAM HCL 2 MG/2ML IJ SOLN
INTRAMUSCULAR | Status: AC
  Filled 2017-11-17: qty 2

## 2017-11-17 MED ORDER — KETOROLAC TROMETHAMINE 30 MG/ML IJ SOLN
INTRAMUSCULAR | Status: DC | PRN
  Administered 2017-11-17: 30 mg via INTRAVENOUS

## 2017-11-17 MED ORDER — OXYCODONE HCL 5 MG PO TABS: 5.0000 mg | ORAL_TABLET | Freq: Once | ORAL | Status: DC | PRN

## 2017-11-17 MED ORDER — FENTANYL CITRATE (PF) 100 MCG/2ML IJ SOLN
INTRAMUSCULAR | Status: AC
  Filled 2017-11-17: qty 2

## 2017-11-17 MED ORDER — PHENYLEPHRINE 40 MCG/ML (10ML) SYRINGE FOR IV PUSH (FOR BLOOD PRESSURE SUPPORT)
PREFILLED_SYRINGE | INTRAVENOUS | Status: AC
  Filled 2017-11-17: qty 10

## 2017-11-17 MED ORDER — GLYCOPYRROLATE PF 0.2 MG/ML IJ SOSY
PREFILLED_SYRINGE | INTRAMUSCULAR | Status: AC
  Filled 2017-11-17: qty 1

## 2017-11-17 MED ORDER — KETOROLAC TROMETHAMINE 30 MG/ML IJ SOLN
INTRAMUSCULAR | Status: AC
  Filled 2017-11-17: qty 1

## 2017-11-17 MED ORDER — PROPOFOL 10 MG/ML IV BOLUS
INTRAVENOUS | Status: DC | PRN
  Administered 2017-11-17: 200 mg via INTRAVENOUS

## 2017-11-17 SURGICAL SUPPLY — 32 items
APPLICATOR COTTON TIP 6 STRL (MISCELLANEOUS) ×4 IMPLANT
APPLICATOR COTTON TIP 6IN STRL (MISCELLANEOUS) ×12
APPLICATOR DR MATTHEWS STRL (MISCELLANEOUS) ×3 IMPLANT
BNDG EYE OVAL (GAUZE/BANDAGES/DRESSINGS) IMPLANT
CAUTERY EYE LOW TEMP 1300F FIN (OPHTHALMIC RELATED) IMPLANT
CLOSURE WOUND 1/4X4 (GAUZE/BANDAGES/DRESSINGS)
COVER BACK TABLE 60X90IN (DRAPES) ×3 IMPLANT
COVER MAYO STAND STRL (DRAPES) ×3 IMPLANT
DRAPE SURG 17X23 STRL (DRAPES) ×6 IMPLANT
DRAPE U-SHAPE 76X120 STRL (DRAPES) ×3 IMPLANT
GLOVE BIO SURGEON STRL SZ 6.5 (GLOVE) ×4 IMPLANT
GLOVE BIO SURGEONS STRL SZ 6.5 (GLOVE) ×2
GLOVE BIOGEL M STRL SZ7.5 (GLOVE) ×3 IMPLANT
GOWN STRL REUS W/ TWL LRG LVL3 (GOWN DISPOSABLE) ×1 IMPLANT
GOWN STRL REUS W/TWL LRG LVL3 (GOWN DISPOSABLE) ×2
GOWN STRL REUS W/TWL XL LVL3 (GOWN DISPOSABLE) ×3 IMPLANT
NS IRRIG 1000ML POUR BTL (IV SOLUTION) ×3 IMPLANT
PACK BASIN DAY SURGERY FS (CUSTOM PROCEDURE TRAY) ×3 IMPLANT
SHEET MEDIUM DRAPE 40X70 STRL (DRAPES) IMPLANT
SPEAR EYE SURG WECK-CEL (MISCELLANEOUS) ×6 IMPLANT
STRIP CLOSURE SKIN 1/4X4 (GAUZE/BANDAGES/DRESSINGS) IMPLANT
SUT 6 0 SILK T G140 8DA (SUTURE) IMPLANT
SUT MERSILENE 6-0 18IN S14 8MM (SUTURE) ×3
SUT PLAIN 6 0 TG1408 (SUTURE) ×3 IMPLANT
SUT SILK 4 0 C 3 735G (SUTURE) ×3 IMPLANT
SUT VICRYL 6 0 S 28 (SUTURE) ×3 IMPLANT
SUT VICRYL ABS 6-0 S29 18IN (SUTURE) IMPLANT
SUTURE MERSLN 6-0 18IN S14 8MM (SUTURE) ×1 IMPLANT
SYR 10ML LL (SYRINGE) ×3 IMPLANT
SYR TB 1ML LL NO SAFETY (SYRINGE) ×3 IMPLANT
TOWEL GREEN STERILE FF (TOWEL DISPOSABLE) ×3 IMPLANT
TRAY DSU PREP LF (CUSTOM PROCEDURE TRAY) ×3 IMPLANT

## 2017-11-17 NOTE — Anesthesia Procedure Notes (Signed)
Procedure Name: LMA Insertion Date/Time: 11/17/2017 9:43 AM Performed by: Ronnette HilaPayne, Lachele Lievanos D, CRNA Pre-anesthesia Checklist: Patient identified, Emergency Drugs available, Suction available and Patient being monitored Patient Re-evaluated:Patient Re-evaluated prior to induction Oxygen Delivery Method: Circle system utilized Preoxygenation: Pre-oxygenation with 100% oxygen Induction Type: IV induction Ventilation: Mask ventilation without difficulty LMA: LMA inserted LMA Size: 5.0 Number of attempts: 1 Airway Equipment and Method: Bite block Placement Confirmation: positive ETCO2 Tube secured with: Tape Dental Injury: Teeth and Oropharynx as per pre-operative assessment
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
## 2017-11-17 NOTE — Anesthesia Postprocedure Evaluation (Signed)
Anesthesia Post Note  Patient: Jeremy Alterry R Reise Jr.  Procedure(s) Performed: BILATERAL STRABISMUS REPAIR (Bilateral Eye)     Patient location during evaluation: PACU Anesthesia Type: General Level of consciousness: awake and alert and oriented Pain management: pain level controlled Vital Signs Assessment: post-procedure vital signs reviewed and stable Respiratory status: spontaneous breathing, nonlabored ventilation and respiratory function stable Cardiovascular status: blood pressure returned to baseline and stable Postop Assessment: no apparent nausea or vomiting Anesthetic complications: no    Last Vitals:  Vitals:   11/17/17 1145 11/17/17 1200  BP: 128/83 130/82  Pulse: 65 68  Resp: 14 11  Temp:    SpO2: 93% 95%    Last Pain:  Vitals:   11/17/17 1130  TempSrc:   PainSc: 2                  Abriella Filkins A.

## 2017-11-17 NOTE — Discharge Instructions (Signed)
Diet: Clear liquids, advance to soft foods then regular diet as tolerated by this evening.  Pain control:   1)  Ibuprofen 600 mg by mouth every 6-8 hours as needed for pain  2)  Ice pack/cold compress to operated eye(s) as desired  Eye medications:    Tobradex or Zylet eye ointment 1/2 inch in operated eye(s) twice a day   Activity: No swimming for 1 week.  It is OK to let water run over the face and eyes while showering or taking a bath, even during the first week.  No other restriction on exercise or activity.   Call Dr. Roxy CedarYoung's office [phone] with any problems or concerns.      No ibuprofen before 4:00pm   Post Anesthesia Home Care Instructions  Activity: Get plenty of rest for the remainder of the day. A responsible individual must stay with you for 24 hours following the procedure.  For the next 24 hours, DO NOT: -Drive a car -Advertising copywriterperate machinery -Drink alcoholic beverages -Take any medication unless instructed by your physician -Make any legal decisions or sign important papers.  Meals: Start with liquid foods such as gelatin or soup. Progress to regular foods as tolerated. Avoid greasy, spicy, heavy foods. If nausea and/or vomiting occur, drink only clear liquids until the nausea and/or vomiting subsides. Call your physician if vomiting continues.  Special Instructions/Symptoms: Your throat may feel dry or sore from the anesthesia or the breathing tube placed in your throat during surgery. If this causes discomfort, gargle with warm salt water. The discomfort should disappear within 24 hours.  If you had a scopolamine patch placed behind your ear for the management of post- operative nausea and/or vomiting:  1. The medication in the patch is effective for 72 hours, after which it should be removed.  Wrap patch in a tissue and discard in the trash. Wash hands thoroughly with soap and water. 2. You may remove the patch earlier than 72 hours if you experience unpleasant  side effects which may include dry mouth, dizziness or visual disturbances. 3. Avoid touching the patch. Wash your hands with soap and water after contact with the patch.

## 2017-11-17 NOTE — H&P (Deleted)
  The note originally documented on this encounter has been moved the the encounter in which it belongs.  

## 2017-11-17 NOTE — Transfer of Care (Signed)
Immediate Anesthesia Transfer of Care Note  Patient: Jeremy Alterry R Justen Jr.  Procedure(s) Performed: BILATERAL STRABISMUS REPAIR (Bilateral Eye)  Patient Location: PACU  Anesthesia Type:General  Level of Consciousness: sedated  Airway & Oxygen Therapy: Patient Spontanous Breathing and Patient connected to face mask oxygen  Post-op Assessment: Report given to RN and Post -op Vital signs reviewed and stable  Post vital signs: Reviewed and stable  Last Vitals:  Vitals Value Taken Time  BP 116/80 11/17/2017 11:00 AM  Temp    Pulse 86 11/17/2017 11:00 AM  Resp 19 11/17/2017 11:00 AM  SpO2 95 % 11/17/2017 11:00 AM  Vitals shown include unvalidated device data.  Last Pain:  Vitals:   11/17/17 0857  TempSrc: Oral  PainSc: 0-No pain         Complications: No apparent anesthesia complications

## 2017-11-17 NOTE — Op Note (Signed)
11/17/2017  10:57 AM  PATIENT:  Jeremy Alterry R Grismore Jr.  52 y.o. male  PRE-OPERATIVE DIAGNOSIS: 1. Left superior oblique palsy     2. s/p left superior oblique tuck  POST-OPERATIVE DIAGNOSIS:  same  PROCEDURE:  1.  Inferior oblique muscle recession, left eye   2.  Anterior superior oblique tuck, right eye  SURGEON:  Pasty SpillersWilliam O.Maple HudsonYoung, M.D.   ANESTHESIA:   general  COMPLICATIONS:None  DESCRIPTION OF PROCEDURE: The patient was taken to the operating room where He was identified by me. General anesthesia was induced without difficulty after placement of appropriate monitors. The patient was prepped and draped in standard sterile fashion. A lid speculum was placed in the left eye.  Through an inferotemporal fornix incision through conjunctiva and Tenon's fascia, the left lateral rectus muscle was engaged on a Gass hook, which was used to draw a traction suture of 4-0 silk under the muscle. This was used to pull the eye up and in. Using 2 muscle hooks through the conjunctival incision for exposure, the left inferior oblique muscle was identified and engaged on oblique hook. It was drawn forward and cleared of its fascial attachments of the way to its insertion, which was secured with a fine curved hemostat. The muscle was disinserted. Its cut and was secured with a double-armed 6-0 Vicryl suture, with a locking bite at each border of the muscle, 1 mm from the insertion. The left inferior rectus muscle was engaged on a series of muscle hooks. A mark was made on the sclera 3 mm posterior and 3 mm temporal to the temporal border of the inferior rectus insertion. This was used as the exit point for the pole sutures of the inferior oblique, which were passed in crossed swords fashion and tied securely. Conjunctiva was closed with a single 6-0 plain gut suture.  The speculum was transferred to the right eye.  Through a superotemporal fornix incision through conjunctiva and tenon's fascia, the right superior  rectus muscle was engaged on a series of muscle hooks.  A Desmarres retractor was introduced into the conjunctival incision and drawn posteriorly, exposing the insertion of the superior oblique tendon under the superior rectus muscle.  The anterior one fourth of the superior oblique fibers were separated from the posterior three fourths with a small hook near the insertion.  This split was taken to about 15 mm posterior to the insertion.  The anterior fourth of the superior oblique fibers were engaged on a tendon Pricilla Holmucker, which was drawn up to 6 mm each limb.  A hemostat was used to apposed the ascending and descending limb of the anterior tendon fibers at the base of the [place]ucker.  The ascending and descending limbs were then joined with 2 separate 6-0 Mersilene sutures, which were tied securely.  The conjunctival incision was closed with a single 6-0 plain gut suture.    TobraDex ointment was placed both eyes. The patient was awakened without difficulty and taken to the recovery room in stable condition, having suffered no intraoperative or immediate postoperative complications.  Pasty SpillersWilliam O. Phyllistine Domingos M.D.    PATIENT DISPOSITION:  PACU - hemodynamically stable.

## 2017-11-17 NOTE — Anesthesia Preprocedure Evaluation (Signed)
Anesthesia Evaluation  Patient identified by MRN, date of birth, ID band Patient awake    Reviewed: Allergy & Precautions, H&P , NPO status , Patient's Chart, lab work & pertinent test results  Airway Mallampati: II   Neck ROM: full    Dental   Pulmonary sleep apnea ,    breath sounds clear to auscultation       Cardiovascular hypertension,  Rhythm:regular Rate:Normal  Non-obstructive CAD. LBBB   Neuro/Psych H/o TBI  Neuromuscular disease    GI/Hepatic GERD  ,  Endo/Other  diabetes, Type 2obese  Renal/GU      Musculoskeletal   Abdominal   Peds  Hematology   Anesthesia Other Findings   Reproductive/Obstetrics                             Anesthesia Physical Anesthesia Plan  ASA: II  Anesthesia Plan: General   Post-op Pain Management:    Induction: Intravenous  PONV Risk Score and Plan: 2 and Ondansetron, Dexamethasone, Treatment may vary due to age or medical condition and Midazolam  Airway Management Planned: LMA  Additional Equipment:   Intra-op Plan:   Post-operative Plan:   Informed Consent: I have reviewed the patients History and Physical, chart, labs and discussed the procedure including the risks, benefits and alternatives for the proposed anesthesia with the patient or authorized representative who has indicated his/her understanding and acceptance.     Plan Discussed with: CRNA, Anesthesiologist and Surgeon  Anesthesia Plan Comments:         Anesthesia Quick Evaluation

## 2017-11-20 ENCOUNTER — Encounter (HOSPITAL_BASED_OUTPATIENT_CLINIC_OR_DEPARTMENT_OTHER): Payer: Self-pay | Admitting: Ophthalmology

## 2024-03-26 ENCOUNTER — Emergency Department (HOSPITAL_BASED_OUTPATIENT_CLINIC_OR_DEPARTMENT_OTHER): Payer: Worker's Compensation

## 2024-03-26 ENCOUNTER — Emergency Department (HOSPITAL_BASED_OUTPATIENT_CLINIC_OR_DEPARTMENT_OTHER)
Admission: EM | Admit: 2024-03-26 | Discharge: 2024-03-26 | Disposition: A | Discharge status: Home / Self Care | Payer: Worker's Compensation | Attending: Emergency Medicine | Admitting: Emergency Medicine

## 2024-03-26 ENCOUNTER — Other Ambulatory Visit: Payer: Self-pay

## 2024-03-26 DIAGNOSIS — I2510 Atherosclerotic heart disease of native coronary artery without angina pectoris: Secondary | ICD-10-CM | POA: Diagnosis not present

## 2024-03-26 DIAGNOSIS — S0990XA Unspecified injury of head, initial encounter: Secondary | ICD-10-CM | POA: Diagnosis not present

## 2024-03-26 DIAGNOSIS — Y92481 Parking lot as the place of occurrence of the external cause: Secondary | ICD-10-CM | POA: Insufficient documentation

## 2024-03-26 DIAGNOSIS — I10 Essential (primary) hypertension: Secondary | ICD-10-CM | POA: Diagnosis not present

## 2024-03-26 DIAGNOSIS — Y990 Civilian activity done for income or pay: Secondary | ICD-10-CM | POA: Insufficient documentation

## 2024-03-26 DIAGNOSIS — R0789 Other chest pain: Secondary | ICD-10-CM | POA: Diagnosis not present

## 2024-03-26 DIAGNOSIS — S59902A Unspecified injury of left elbow, initial encounter: Secondary | ICD-10-CM | POA: Diagnosis present

## 2024-03-26 DIAGNOSIS — E119 Type 2 diabetes mellitus without complications: Secondary | ICD-10-CM | POA: Insufficient documentation

## 2024-03-26 DIAGNOSIS — S51012A Laceration without foreign body of left elbow, initial encounter: Secondary | ICD-10-CM | POA: Diagnosis not present

## 2024-03-26 DIAGNOSIS — Z79899 Other long term (current) drug therapy: Secondary | ICD-10-CM | POA: Insufficient documentation

## 2024-03-26 DIAGNOSIS — Z9641 Presence of insulin pump (external) (internal): Secondary | ICD-10-CM | POA: Insufficient documentation

## 2024-03-26 DIAGNOSIS — W01198A Fall on same level from slipping, tripping and stumbling with subsequent striking against other object, initial encounter: Secondary | ICD-10-CM | POA: Diagnosis not present

## 2024-03-26 DIAGNOSIS — Y9301 Activity, walking, marching and hiking: Secondary | ICD-10-CM | POA: Insufficient documentation

## 2024-03-26 DIAGNOSIS — W19XXXA Unspecified fall, initial encounter: Secondary | ICD-10-CM

## 2024-03-26 DIAGNOSIS — Z7982 Long term (current) use of aspirin: Secondary | ICD-10-CM | POA: Diagnosis not present

## 2024-03-26 MED ORDER — LIDOCAINE HCL (PF) 1 % IJ SOLN
5.0000 mL | Freq: Once | INTRAMUSCULAR | Status: AC
  Administered 2024-03-26: 5 mL
  Filled 2024-03-26: qty 5

## 2024-03-26 MED ORDER — LIDOCAINE 5 % EX PTCH
1.0000 | MEDICATED_PATCH | CUTANEOUS | Status: DC
  Administered 2024-03-26: 1 via TRANSDERMAL
  Filled 2024-03-26: qty 1

## 2024-03-26 MED ORDER — ACETAMINOPHEN 500 MG PO TABS
1000.0000 mg | ORAL_TABLET | Freq: Once | ORAL | Status: AC
  Administered 2024-03-26: 1000 mg via ORAL
  Filled 2024-03-26: qty 2

## 2024-03-26 NOTE — Discharge Instructions (Signed)
 You will be sore for several days.  Take Tylenol  or ibuprofen every 6 hours as needed for pain.  May use over-the-counter lidocaine  patches on the ribs for up to 12 hours at a time.  May apply heat pads to the ribs to help with pain as well.  Keep stitches clean and dry.  Please follow-up in 7 days for suture removal.  Follow-up with primary care doctor in 1 week for any continued concerns.  Return to ED if any symptoms worsen including severe uncontrolled headache, uncontrollable nausea/vomiting, syncopal episode, difficulty breathing, signs of infection around the wound.

## 2024-03-26 NOTE — ED Provider Notes (Signed)
 " [facility] Provider Note   CSN: [id number] Arrival date & time: 03/26/24  1612     Patient presents with: Jeremy Wood Jeremy Wood. is a 59 y.o. male.  Patient is a 59 year old male with a history of TBI, diabetes, hypertension, and CAD who presents to the ED for evaluation after a fall.  Notes he was walking in the parking lot at 1 AM after work this morning and states he slipped on ice.  He fell back at this time and landed on the concrete.  Notes hitting head.  States he has had nausea today but denies vomiting or loss of consciousness.  He is on aspirin but no other blood thinners.  He also states he is having pain in the left ribs as well as pain on the left elbow.  He notes he does have a cut on the left elbow.  States tetanus is up-to-date.  States the elbow is still bleeding and he is concerned about this as well.  Denies dizziness, syncope, chest pain, abdominal pain, diarrhea, urinary symptoms, incontinence.  No further complaints.    Fall Associated symptoms include chest pain (Chest wall pain) and headaches. Pertinent negatives include no shortness of breath.       Prior to Admission medications  Medication Sig Start Date End Date Taking? Authorizing Provider  clindamycin-benzoyl peroxide (BENZACLIN) gel Apply 1 Application topically 2 (two) times daily. 03/10/24  Yes [provider]  DULoxetine (CYMBALTA) 30 MG capsule Take 30 mg by mouth daily. 01/28/21  Yes [provider]  Insulin  Disposable Pump (OMNIPOD 5 DEXG7G6 PODS GEN 5) MISC 1 DEVICE BY DOES NOT APPLY ROUTE EVERY THIRD DAY. DX E10.42 02/01/21  Yes [provider]  irbesartan (AVAPRO) 150 MG tablet Take 150 mg by mouth daily. 03/11/24  Yes [provider]  metoprolol succinate (TOPROL-XL) 25 MG 24 hr tablet Take 25 mg by mouth daily. 12/10/20  Yes [provider]  PROLENSA 0.07 % SOLN Apply 1 drop to eye daily. 03/07/24  Yes  [provider]  rosuvastatin (CRESTOR) 20 MG tablet Take 20 mg by mouth daily. 12/17/20  Yes [provider]  ZEPBOUND 15 MG/0.5ML Pen Inject 15 mg into the skin once a week. 03/15/24  Yes [provider]  aspirin EC 81 MG tablet Take 81 mg by mouth daily.    [provider]  brimonidine (ALPHAGAN) 0.2 % ophthalmic solution SMARTSIG:In Eye(s)    [provider]  citalopram (CELEXA) 20 MG tablet Take 20 mg by mouth daily.    [provider]  Continuous Glucose Sensor (DEXCOM G7 15 DAY SENSOR) MISC CHANGE SENSOR EVERY 15 DAYS    [provider]  gabapentin (NEURONTIN) 300 MG capsule Take 300 mg by mouth 3 (three) times daily.     [provider]  HUMALOG 100 UNIT/ML injection SMARTSIG:Unit(s)    [provider]  insulin  aspart (NOVOLOG ) 100 UNIT/ML injection Inject into the skin 3 (three) times daily with meals.    [provider]  insulin  glargine (LANTUS) 100 UNIT/ML injection Inject 70 Units into the skin at bedtime.    [provider]  lisinopril (PRINIVIL,ZESTRIL) 5 MG tablet Take 10 mg by mouth daily.     [provider]  loratadine (CLARITIN) 10 MG tablet Take 10 mg by mouth daily.    [provider]  naproxen (NAPROSYN) 500 MG tablet Take 500 mg by mouth 2 (two) times daily  with a meal.    [provider]  simvastatin (ZOCOR) 20 MG tablet Take 20 mg by mouth daily.    [provider]  tamsulosin (FLOMAX) 0.4 MG CAPS capsule Take 0.4 mg by mouth.    [provider]  tiZANidine (ZANAFLEX) 4 MG tablet Take 4 mg by mouth 3 (three) times daily.    [provider]  traMADol (ULTRAM) 50 MG tablet Take 50 mg by mouth every 6 (six) hours as needed.    [provider]  traZODone (DESYREL) 50 MG tablet Take 50 mg by mouth at bedtime.    [provider]    Allergies: Patient has no known allergies.    Review of Systems   Constitutional:  Negative for fever.  Respiratory:  Negative for shortness of breath.   Cardiovascular:  Positive for chest pain (Chest wall pain).  Skin:  Positive for wound.  Neurological:  Positive for headaches. Negative for dizziness and syncope.  All other systems reviewed and are negative.   Updated Vital Signs BP (!) 159/88 (BP Location: Right Arm)   Pulse 88   Temp 98.2 F (36.8 C) (Oral)   Resp 20   Ht 6' 1 (1.854 m)   Wt 108.9 kg   SpO2 96%   BMI 31.66 kg/m   Physical Exam Constitutional:      Appearance: Normal appearance.  HENT:     Head: Normocephalic and atraumatic.     Mouth/Throat:     Mouth: Mucous membranes are moist.     Pharynx: Oropharynx is clear.  Eyes:     Extraocular Movements: Extraocular movements intact.     Pupils: Pupils are equal, round, and reactive to light.  Cardiovascular:     Rate and Rhythm: Normal rate.     Pulses: Normal pulses.     Comments: Radial pulses 2+ bilaterally Pulmonary:     Effort: Pulmonary effort is normal.     Breath sounds: Normal breath sounds.     Comments: Tender to palpation on the left anterior chest wall over the 8th through 10th ribs.  No deformities, flail chest, erythema, edema noted. Chest:     Chest wall: Tenderness present.  Musculoskeletal:        General: Normal range of motion.     Comments: Full range of motion of the left elbow.  1 cm laceration present to the left posterior elbow.  No active bleeding or drainage.  Ambulatory with normal gait.  Skin:    General: Skin is warm.  Neurological:     Mental Status: He is alert and oriented to person, place, and time.  Psychiatric:        Mood and Affect: Mood normal.        Behavior: Behavior normal.     (all labs ordered are listed, but only abnormal results are displayed) Labs Reviewed - No data to display  EKG: None  Radiology: CT Head Wo Contrast Result Date: 03/26/2024 EXAM: CT HEAD WITHOUT CONTRAST 03/26/2024 05:39:40 PM  TECHNIQUE: CT of the head was performed without the administration of intravenous contrast. Automated exposure control, iterative reconstruction, and/or weight based adjustment of the mA/kV was utilized to reduce the radiation dose to as low as reasonably achievable. COMPARISON: None available. CLINICAL HISTORY: Patient fell. FINDINGS: BRAIN AND VENTRICLES: No acute hemorrhage. No evidence of acute infarct. No extra-axial collection. No mass effect or midline shift. ORBITS: No acute abnormality. SINUSES: Mucosal thickening in left maxillary sinus. Bilateral old maxillary fractures. SOFT TISSUES  AND SKULL: Left mastoidectomy noted. No acute soft tissue abnormality. No skull fracture. IMPRESSION: 1. No acute intracranial abnormality. Electronically signed by: Oneil Devonshire MD 03/26/2024 05:48 PM EST RP Workstation: [id number]   DG Elbow Complete Left Result Date: 03/26/2024 EXAM: 3 VIEW(S) XRAY OF THE LEFT ELBOW COMPARISON: None available. CLINICAL HISTORY: Recent fall with elbow pain, initial encounter. FINDINGS: BONES AND JOINTS: No acute fracture. No malalignment. SOFT TISSUES: Soft tissue swelling and subcutaneous air along the posterior aspect of the elbow consistent with posttraumatic injury. Vascular calcifications. IMPRESSION: 1. Soft tissue swelling and subcutaneous air along the posterior aspect of the left elbow, consistent with posttraumatic injury. Electronically signed by: Oneil Devonshire MD 03/26/2024 05:46 PM EST RP Workstation: [id number]   DG Ribs Unilateral W/Chest Left Result Date: 03/26/2024 EXAM: 1 VIEW(S) XRAY OF THE LEFT RIBS AND CHEST 03/26/2024 05:12:45 PM COMPARISON: None available. CLINICAL HISTORY: Fall, left rib pain worse with breathing. FINDINGS: BONES: Postsurgical changes are seen. Multiple old left rib fractures are noted. No acute fractures seen. LUNGS AND PLEURA: No consolidation or pulmonary edema. No pleural effusion or pneumothorax. HEART AND MEDIASTINUM: No acute abnormality of  the cardiac and mediastinal silhouettes. IMPRESSION: 1. No acute left rib fracture. 2. Multiple old left rib fractures. Electronically signed by: Oneil Devonshire MD 03/26/2024 05:46 PM EST RP Workstation: [id number]    .Laceration Repair  Date/Time: 03/26/2024 7:42 PM  Performed by: Neysa Thersia RAMAN, PA-C Authorized by: Neysa Thersia RAMAN, PA-C   Consent:    Consent obtained:  Verbal   Consent given by:  Patient   Risks discussed:  Infection Anesthesia:    Anesthesia method:  Local infiltration   Local anesthetic:  Lidocaine  1% w/o epi Laceration details:    Location:  Shoulder/arm   Shoulder/arm location:  L elbow   Length (cm):  2   Depth (mm):  1 Pre-procedure details:    Preparation:  Patient was prepped and draped in usual sterile fashion Exploration:    Hemostasis achieved with:  Direct pressure   Imaging obtained: x-ray     Imaging outcome: foreign body not noted   Treatment:    Area cleansed with:  Povidone-iodine and saline   Amount of cleaning:  Standard   Irrigation solution:  Sterile saline   Irrigation volume:  50cc   Irrigation method:  Pressure wash   Debridement:  None Skin repair:    Repair method:  Sutures   Suture size:  4-0   Suture material:  Prolene   Suture technique:  Simple interrupted   Number of sutures:  3 Approximation:    Approximation:  Close Repair type:    Repair type:  Simple Post-procedure details:    Dressing:  Non-adherent dressing   Procedure completion:  Tolerated well, no immediate complications    Medications Ordered in the ED  lidocaine  (LIDODERM ) 5 % 1 patch (1 patch Transdermal Patch Applied 03/26/24 1849)  acetaminophen  (TYLENOL ) tablet 1,000 mg (1,000 mg Oral Given 03/26/24 1850)  lidocaine  (PF) (XYLOCAINE ) 1 % injection 5 mL (5 mLs Infiltration Given by Other 03/26/24 1851)                                    Medical Decision Making Patient is a 59 year old male who presents to the ED for a fall that occurred around 1 AM  last evening.  Notes headache, left rib pain, and a laceration to the left elbow.  Please  see detailed HPI above.  On exam patient is alert and in no acute distress.  Physical exam as noted above.  Neurovascularly intact with no acute deficits.  He does have left anterior rib pain but no flail chest noted.  Lungs clear to auscultation.  Small laceration noted to the left elbow with no active bleeding.  Full range of motion and distally intact.  CT of the head reviewed and negative for acute process.  Chest x-ray with left ribs negative for acute fracture.  Chronic previous fractures noted but no new abnormalities.  X-ray of the left elbow shows some soft tissue swelling and subcu air along the posterior aspect consistent with laceration but no acute fractures.  This fall was mechanical as patient slipped on ice.  Less concerns for intracranial abnormality with reassuring imaging.  Suspect patient does have minor concussion but otherwise well-appearing.  Thankfully no acute pneumothorax or fractures noted on chest x-ray.  Suspect contusion.  Wound on elbow was irrigated thoroughly and repaired with 3 sutures.  States his tetanus is up-to-date.  Otherwise stable for discharge home.  Patient was given Tylenol  and lidocaine  patch while in ED.  Symptomatic care for home discussed.  Wound care instructions provided.  Advised to follow-up in 7 days for suture removal.  Advised PCP follow-up as well in 1 week for recheck.  Return precautions provided for worsening symptoms.   Amount and/or Complexity of Data Reviewed Radiology: ordered.  Risk OTC drugs. Prescription drug management.      Final diagnoses:  Fall, initial encounter  Injury of head, initial encounter  Laceration of left elbow, initial encounter  Rib pain on left side    ED Discharge Orders     None          Neysa Thersia GORMAN DEVONNA 03/26/24 1946    Lenor Hollering, MD 03/26/24 2117  "

## 2024-03-26 NOTE — ED Triage Notes (Signed)
 States slipped on ice this morning. Fell backwards and hit head/ back on ground. C/o headache, nausea. Left elbow laceration.   Takes 81mg  asa, denies thinners. Denies LOC.

## 2024-03-26 NOTE — ED Notes (Signed)
